# Patient Record
Sex: Male | Born: 1970 | Race: White | Hispanic: No | Marital: Married | Smoking: Never smoker
Health system: Southern US, Community
[De-identification: ages and names within clinical notes are randomized; demographics above are authoritative.]

## PROBLEM LIST (undated history)

## (undated) DIAGNOSIS — F329 Major depressive disorder, single episode, unspecified: Secondary | ICD-10-CM

## (undated) DIAGNOSIS — F32A Depression, unspecified: Secondary | ICD-10-CM

## (undated) DIAGNOSIS — E785 Hyperlipidemia, unspecified: Secondary | ICD-10-CM

## (undated) HISTORY — PX: ELBOW ARTHROSCOPY: SUR87

## (undated) HISTORY — PX: KNEE ARTHROSCOPY: SUR90

## (undated) HISTORY — PX: CARDIAC CATHETERIZATION: SHX172

## (undated) HISTORY — PX: SHOULDER ARTHROSCOPY: SHX128

---

## 2003-12-05 ENCOUNTER — Other Ambulatory Visit: Payer: Self-pay

## 2007-11-22 ENCOUNTER — Ambulatory Visit: Payer: Self-pay | Admitting: General Practice

## 2009-12-17 ENCOUNTER — Emergency Department: Payer: Self-pay | Admitting: Emergency Medicine

## 2010-07-27 ENCOUNTER — Observation Stay (HOSPITAL_COMMUNITY): Admission: EM | Admit: 2010-07-27 | Discharge: 2010-07-29 | Payer: Self-pay | Admitting: Emergency Medicine

## 2010-08-15 ENCOUNTER — Ambulatory Visit: Payer: Self-pay | Admitting: General Practice

## 2010-09-04 ENCOUNTER — Ambulatory Visit: Payer: Self-pay | Admitting: General Practice

## 2011-01-09 LAB — CBC
HCT: 36.2 % — ABNORMAL LOW (ref 39.0–52.0)
HCT: 36.9 % — ABNORMAL LOW (ref 39.0–52.0)
HCT: 39.6 % (ref 39.0–52.0)
Hemoglobin: 12.6 g/dL — ABNORMAL LOW (ref 13.0–17.0)
Hemoglobin: 12.7 g/dL — ABNORMAL LOW (ref 13.0–17.0)
Hemoglobin: 14.1 g/dL (ref 13.0–17.0)
MCH: 29.4 pg (ref 26.0–34.0)
MCH: 29.7 pg (ref 26.0–34.0)
MCH: 30.3 pg (ref 26.0–34.0)
MCHC: 34.4 g/dL (ref 30.0–36.0)
MCHC: 34.8 g/dL (ref 30.0–36.0)
MCHC: 35.6 g/dL (ref 30.0–36.0)
MCV: 85 fL (ref 78.0–100.0)
MCV: 85.4 fL (ref 78.0–100.0)
MCV: 85.4 fL (ref 78.0–100.0)
Platelets: 219 10*3/uL (ref 150–400)
Platelets: 223 10*3/uL (ref 150–400)
Platelets: 231 10*3/uL (ref 150–400)
RBC: 4.24 MIL/uL (ref 4.22–5.81)
RBC: 4.32 MIL/uL (ref 4.22–5.81)
RBC: 4.66 MIL/uL (ref 4.22–5.81)
RDW: 12.6 % (ref 11.5–15.5)
RDW: 12.7 % (ref 11.5–15.5)
RDW: 12.7 % (ref 11.5–15.5)
WBC: 6.4 10*3/uL (ref 4.0–10.5)
WBC: 6.6 10*3/uL (ref 4.0–10.5)
WBC: 7.4 10*3/uL (ref 4.0–10.5)

## 2011-01-09 LAB — CK TOTAL AND CKMB (NOT AT ARMC)
CK, MB: 1.2 ng/mL (ref 0.3–4.0)
Relative Index: INVALID (ref 0.0–2.5)
Total CK: 98 U/L (ref 7–232)

## 2011-01-09 LAB — COMPREHENSIVE METABOLIC PANEL
ALT: 19 U/L (ref 0–53)
AST: 24 U/L (ref 0–37)
Albumin: 3.6 g/dL (ref 3.5–5.2)
Alkaline Phosphatase: 58 U/L (ref 39–117)
BUN: 16 mg/dL (ref 6–23)
CO2: 23 mEq/L (ref 19–32)
Calcium: 8.8 mg/dL (ref 8.4–10.5)
Chloride: 104 mEq/L (ref 96–112)
Creatinine, Ser: 1.05 mg/dL (ref 0.4–1.5)
GFR calc Af Amer: >60 mL/min (ref >60)
GFR calc non Af Amer: >60 mL/min (ref >60)
Glucose, Bld: 105 mg/dL — ABNORMAL HIGH (ref 70–99)
Potassium: 3.7 mEq/L (ref 3.5–5.1)
Sodium: 133 mEq/L — ABNORMAL LOW (ref 135–145)
Total Bilirubin: 0.9 mg/dL (ref 0.3–1.2)
Total Protein: 6.1 g/dL (ref 6.0–8.3)

## 2011-01-09 LAB — CARDIAC PANEL(CRET KIN+CKTOT+MB+TROPI)
CK, MB: 1.6 ng/mL (ref 0.3–4.0)
CK, MB: 1.7 ng/mL (ref 0.3–4.0)
Relative Index: 1.4 (ref 0.0–2.5)
Relative Index: 1.5 (ref 0.0–2.5)
Total CK: 111 U/L (ref 7–232)
Total CK: 116 U/L (ref 7–232)
Troponin I: 0.02 ng/mL (ref 0.00–0.06)
Troponin I: 0.02 ng/mL (ref 0.00–0.06)

## 2011-01-09 LAB — GLUCOSE, CAPILLARY: Glucose-Capillary: 123 mg/dL — ABNORMAL HIGH (ref 70–99)

## 2011-01-09 LAB — HEPARIN LEVEL (UNFRACTIONATED)
Heparin Unfractionated: 0.29 IU/mL — ABNORMAL LOW (ref 0.30–0.70)
Heparin Unfractionated: 0.3 IU/mL (ref 0.30–0.70)
Heparin Unfractionated: 0.46 IU/mL (ref 0.30–0.70)

## 2011-01-09 LAB — POCT CARDIAC MARKERS
CKMB, poc: <1 ng/mL — ABNORMAL LOW (ref 1.0–8.0)
Myoglobin, poc: 51.8 ng/mL (ref 12–200)
Troponin i, poc: <0.05 ng/mL (ref 0.00–0.09)

## 2011-01-09 LAB — BASIC METABOLIC PANEL
BUN: 7 mg/dL (ref 6–23)
CO2: 29 mEq/L (ref 19–32)
Calcium: 8.9 mg/dL (ref 8.4–10.5)
Chloride: 106 mEq/L (ref 96–112)
Creatinine, Ser: 0.94 mg/dL (ref 0.4–1.5)
GFR calc Af Amer: >60 mL/min (ref >60)
GFR calc non Af Amer: >60 mL/min (ref >60)
Glucose, Bld: 90 mg/dL (ref 70–99)
Potassium: 3.8 mEq/L (ref 3.5–5.1)
Sodium: 140 mEq/L (ref 135–145)

## 2011-01-09 LAB — DIFFERENTIAL
Basophils Absolute: 0 10*3/uL (ref 0.0–0.1)
Basophils Relative: 0 % (ref 0–1)
Eosinophils Absolute: 0.6 10*3/uL (ref 0.0–0.7)
Eosinophils Relative: 8 % — ABNORMAL HIGH (ref 0–5)
Lymphocytes Relative: 30 % (ref 12–46)
Lymphs Abs: 2.2 10*3/uL (ref 0.7–4.0)
Monocytes Absolute: 0.8 10*3/uL (ref 0.1–1.0)
Monocytes Relative: 10 % (ref 3–12)
Neutro Abs: 3.8 10*3/uL (ref 1.7–7.7)
Neutrophils Relative %: 51 % (ref 43–77)

## 2011-01-09 LAB — TSH: TSH: 2.766 u[IU]/mL (ref 0.350–4.500)

## 2011-01-09 LAB — APTT
aPTT: 34 seconds (ref 24–37)
aPTT: 97 seconds — ABNORMAL HIGH (ref 24–37)

## 2011-01-09 LAB — PROTIME-INR
INR: 0.92 (ref 0.00–1.49)
INR: 0.92 (ref 0.00–1.49)
Prothrombin Time: 12.6 seconds (ref 11.6–15.2)
Prothrombin Time: 12.6 seconds (ref 11.6–15.2)

## 2011-01-09 LAB — TROPONIN I: Troponin I: 0.02 ng/mL (ref 0.00–0.06)

## 2011-01-09 LAB — D-DIMER, QUANTITATIVE: D-Dimer, Quant: <0.22 ug/mL-FEU (ref 0.00–0.48)

## 2011-01-09 LAB — MAGNESIUM: Magnesium: 1.9 mg/dL (ref 1.5–2.5)

## 2011-01-09 LAB — MRSA PCR SCREENING: MRSA by PCR: NEGATIVE

## 2011-01-16 ENCOUNTER — Ambulatory Visit: Payer: Self-pay | Admitting: General Practice

## 2012-08-19 ENCOUNTER — Ambulatory Visit: Payer: Self-pay | Admitting: Gastroenterology

## 2014-09-13 ENCOUNTER — Ambulatory Visit: Payer: Self-pay | Admitting: General Practice

## 2015-03-15 ENCOUNTER — Other Ambulatory Visit: Payer: Self-pay | Admitting: Gastroenterology

## 2015-03-15 DIAGNOSIS — R1310 Dysphagia, unspecified: Secondary | ICD-10-CM

## 2015-03-19 ENCOUNTER — Ambulatory Visit: Payer: Self-pay | Attending: Gastroenterology

## 2015-05-03 ENCOUNTER — Encounter: Payer: Self-pay | Admitting: *Deleted

## 2015-05-04 ENCOUNTER — Ambulatory Visit: Payer: BLUE CROSS/BLUE SHIELD | Admitting: Anesthesiology

## 2015-05-04 ENCOUNTER — Encounter
Admission: RE | Disposition: A | Discharge status: Home / Self Care | Payer: Self-pay | Source: Ambulatory Visit | Attending: Gastroenterology

## 2015-05-04 ENCOUNTER — Encounter: Payer: Self-pay | Admitting: *Deleted

## 2015-05-04 ENCOUNTER — Ambulatory Visit
Admission: RE | Admit: 2015-05-04 | Discharge: 2015-05-04 | Disposition: A | Discharge status: Home / Self Care | Payer: BLUE CROSS/BLUE SHIELD | Source: Ambulatory Visit | Attending: Gastroenterology | Admitting: Gastroenterology

## 2015-05-04 DIAGNOSIS — R1310 Dysphagia, unspecified: Secondary | ICD-10-CM | POA: Diagnosis not present

## 2015-05-04 DIAGNOSIS — Z7982 Long term (current) use of aspirin: Secondary | ICD-10-CM | POA: Diagnosis not present

## 2015-05-04 DIAGNOSIS — Z8371 Family history of colonic polyps: Secondary | ICD-10-CM | POA: Insufficient documentation

## 2015-05-04 DIAGNOSIS — E785 Hyperlipidemia, unspecified: Secondary | ICD-10-CM | POA: Diagnosis not present

## 2015-05-04 DIAGNOSIS — K319 Disease of stomach and duodenum, unspecified: Secondary | ICD-10-CM | POA: Insufficient documentation

## 2015-05-04 DIAGNOSIS — D123 Benign neoplasm of transverse colon: Secondary | ICD-10-CM | POA: Diagnosis not present

## 2015-05-04 DIAGNOSIS — Z800 Family history of malignant neoplasm of digestive organs: Secondary | ICD-10-CM | POA: Insufficient documentation

## 2015-05-04 DIAGNOSIS — K2960 Other gastritis without bleeding: Secondary | ICD-10-CM | POA: Diagnosis not present

## 2015-05-04 DIAGNOSIS — K209 Esophagitis, unspecified: Secondary | ICD-10-CM | POA: Diagnosis not present

## 2015-05-04 DIAGNOSIS — I252 Old myocardial infarction: Secondary | ICD-10-CM | POA: Diagnosis not present

## 2015-05-04 DIAGNOSIS — K644 Residual hemorrhoidal skin tags: Secondary | ICD-10-CM | POA: Insufficient documentation

## 2015-05-04 DIAGNOSIS — Z79899 Other long term (current) drug therapy: Secondary | ICD-10-CM | POA: Insufficient documentation

## 2015-05-04 HISTORY — PX: COLONOSCOPY WITH PROPOFOL: SHX5780

## 2015-05-04 HISTORY — PX: ESOPHAGOGASTRODUODENOSCOPY: SHX5428

## 2015-05-04 HISTORY — DX: Hyperlipidemia, unspecified: E78.5

## 2015-05-04 HISTORY — DX: Depression, unspecified: F32.A

## 2015-05-04 HISTORY — DX: Major depressive disorder, single episode, unspecified: F32.9

## 2015-05-04 SURGERY — COLONOSCOPY WITH PROPOFOL
Anesthesia: General

## 2015-05-04 MED ORDER — MIDAZOLAM HCL 5 MG/5ML IJ SOLN
INTRAMUSCULAR | Status: DC | PRN
  Administered 2015-05-04: 1 mg via INTRAVENOUS

## 2015-05-04 MED ORDER — PROPOFOL INFUSION 10 MG/ML OPTIME
INTRAVENOUS | Status: DC | PRN
  Administered 2015-05-04: 140 ug/kg/min via INTRAVENOUS

## 2015-05-04 MED ORDER — SODIUM CHLORIDE 0.9 % IV SOLN
INTRAVENOUS | Status: DC
  Administered 2015-05-04: 1000 mL via INTRAVENOUS

## 2015-05-04 MED ORDER — PROPOFOL 10 MG/ML IV BOLUS
INTRAVENOUS | Status: DC | PRN
  Administered 2015-05-04: 40 mg via INTRAVENOUS
  Administered 2015-05-04: 50 mg via INTRAVENOUS

## 2015-05-04 MED ORDER — FENTANYL CITRATE (PF) 100 MCG/2ML IJ SOLN
INTRAMUSCULAR | Status: DC | PRN
  Administered 2015-05-04: 50 ug via INTRAVENOUS

## 2015-05-04 NOTE — H&P (Signed)
Outpatient short stay form Pre-procedure 05/04/2015 11:16 AM Lollie Sails MD  Primary Physician: Dr. Burt Ek  Reason for visit:  EGD and colonoscopy  History of present illness:  Patient is a 44 year old male seen for evaluation of dysphagia as well as a family history of colon polyps at a younger age. His sister had colon polyps 34. Is a family history of colon cancer and a secondary relative. In regards to his dysphagia this is been going on for at least 4 years progressively seemed to be worse is no painful swallowing. He has not been taking a PPI.    Current facility-administered medications:  .  0.9 %  sodium chloride infusion, , Intravenous, Continuous, Lollie Sails, MD, Last Rate: 20 mL/hr at 05/04/15 1042, 1,000 mL at 05/04/15 1042  Prescriptions prior to admission  Medication Sig Dispense Refill Last Dose  . aspirin EC 81 MG tablet Take 81 mg by mouth daily.   Past Week at Unknown time  . fenofibrate 160 MG tablet Take 160 mg by mouth daily.   Past Week at Unknown time  . Multiple Vitamin (MULTIVITAMIN) tablet Take 1 tablet by mouth daily.   Past Week at Unknown time  . omega-3 acid ethyl esters (LOVAZA) 1 G capsule Take 1 g by mouth daily.   Past Week at Unknown time     No Known Allergies   Past Medical History  Diagnosis Date  . Hyperlipidemia   . Depression     Review of systems:      Physical Exam    Heart and lungs: Regular rate and rhythm without rub or gallop lungs are bilaterally clear    HEENT: Normocephalic atraumatic eyes are anicteric    Other:     Pertinant exam for procedure: Soft nontender nondistended bowel sounds positive normoactive    Planned proceedures: EGD and colonoscopy with indicated procedures I have discussed the risks benefits and complications of procedures to include not limited to bleeding, infection, perforation and the risk of sedation and the patient wishes to proceed.    Lollie Sails,  MD Gastroenterology 05/04/2015  11:16 AM

## 2015-05-04 NOTE — Transfer of Care (Signed)
Immediate Anesthesia Transfer of Care Note  Patient: Cory Cox  Procedure(s) Performed: Procedure(s): COLONOSCOPY WITH PROPOFOL (N/A) ESOPHAGOGASTRODUODENOSCOPY (EGD) (N/A)  Patient Location: PACU and Endoscopy Unit  Anesthesia Type:General  Level of Consciousness: awake, alert  and oriented  Airway & Oxygen Therapy: Patient Spontanous Breathing and Patient connected to nasal cannula oxygen  Post-op Assessment: Report given to RN and Post -op Vital signs reviewed and stable  Post vital signs: Reviewed and stable  Last Vitals:  Filed Vitals:   05/04/15 1221  BP:   Pulse:   Temp: 36.3 C  Resp:     Complications: No apparent anesthesia complications

## 2015-05-04 NOTE — Anesthesia Postprocedure Evaluation (Signed)
  Anesthesia Post-op Note  Patient: Cory Cox  Procedure(s) Performed: Procedure(s): COLONOSCOPY WITH PROPOFOL (N/A) ESOPHAGOGASTRODUODENOSCOPY (EGD) (N/A)  Anesthesia type:General  Patient location: PACU  Post pain: Pain level controlled  Post assessment: Post-op Vital signs reviewed, Patient's Cardiovascular Status Stable, Respiratory Function Stable, Patent Airway and No signs of Nausea or vomiting  Post vital signs: Reviewed and stable  Last Vitals:  Filed Vitals:   05/04/15 1249  BP: 115/81  Pulse:   Temp:   Resp: 17    Level of consciousness: awake, alert  and patient cooperative  Complications: No apparent anesthesia complications

## 2015-05-04 NOTE — H&P (Deleted)
Outpatient short stay form Pre-procedure 05/04/2015 10:17 AM Lollie Sails MD  Primary Physician: Dr. Midge Minium  Reason for visit:  Colonoscopy  History of present illness:  Patient is a 44 year old male with a personal history of adenomatous colon polyps. Her last last was apparently done in 2013. He had polyps on that procedure. He had a EGD was done by Dr. Buddy Duty in [place] at [place].  Patient denies any problems with nausea vomiting heartburn or dysphagia. Does take Protonix twice a day. No apparent history of its esophagus. Take Carafate.    Current facility-administered medications:  .  0.9 %  sodium chloride infusion, , Intravenous, Continuous, Lollie Sails, MD  Prescriptions prior to admission  Medication Sig Dispense Refill Last Dose  . aspirin EC 81 MG tablet Take 81 mg by mouth daily.     . fenofibrate 160 MG tablet Take 160 mg by mouth daily.     . Multiple Vitamin (MULTIVITAMIN) tablet Take 1 tablet by mouth daily.     Marland Kitchen omega-3 acid ethyl esters (LOVAZA) 1 G capsule Take 1 g by mouth daily.        Allergies not on file   Past Medical History  Diagnosis Date  . Hyperlipidemia   . Depression     Review of systems:      Physical Exam    Heart and lungs: Regular rate and rhythm without rub or gallop lungs are bilaterally clear    HEENT: Normocephalic atraumatic eyes are anicteric    Other:    Pertinant exam for procedure: Soft nontender nondistended bowel sounds positive normoactive    Planned proceedures:  colonoscopy and indicated procedures I have discussed the risks benefits and complications of procedures to include not limited to bleeding, infection, perforation and the risk of sedation and the patient wishes to proceed.    Lollie Sails, MD Gastroenterology 05/04/2015  10:17 AM

## 2015-05-04 NOTE — Anesthesia Procedure Notes (Signed)
Date/Time: 05/04/2015 11:25 AM Performed by: Kennon Holter Pre-anesthesia Checklist: Timeout performed, Patient being monitored, Suction available, Emergency Drugs available and Patient identified Patient Re-evaluated:Patient Re-evaluated prior to inductionOxygen Delivery Method: Nasal cannula Preoxygenation: Pre-oxygenation with 100% oxygen Intubation Type: IV induction

## 2015-05-04 NOTE — Op Note (Signed)
[facility] Gastroenterology Patient Name: Cory Cox Procedure Date: 05/04/2015 11:12 AM MRN: [id number] Account #: [id number] Date of Birth: 1971/07/16 Admit Type: Outpatient Age: 44 Room: [facility] ENDO ROOM 3 Gender: Male Note Status: Finalized Procedure:         Colonoscopy Indications:       Family history of colon cancer in a distant relative,                     Family history of colonic polyps in a first-degree relative Providers:         Lollie Sails, MD Referring MD:      Shepard General, MD (Referring MD) Medicines:         Monitored Anesthesia Care Complications:     No immediate complications. Procedure:         Pre-Anesthesia Assessment:                    - ASA Grade Assessment: II - A patient with mild systemic                     disease.                    After obtaining informed consent, the colonoscope was                     passed under direct vision. Throughout the procedure, the                     patient's blood pressure, pulse, and oxygen saturations                     were monitored continuously. The Olympus PCF-160AL                     colonoscope (S#. [id number]) was introduced through the anus                     and advanced to the the cecum, identified by appendiceal                     orifice and ileocecal valve. The patient tolerated the                     procedure well. The quality of the bowel preparation was                     good. Findings:      A 4 mm polyp was found in the distal transverse colon. The polyp was       flat. The polyp was removed with a cold snare. Resection and retrieval       were complete.      The digital rectal exam was normal.      The retroflexed view of the distal rectum and anal verge was normal and       showed no anal or rectal abnormalities.      The exam was otherwise without abnormality. Impression:        - One 4 mm polyp in the distal transverse colon. Resected    and retrieved.                    - The distal rectum and anal verge are normal on  retroflexion view. Recommendation:    - Await pathology results.                    - Telephone GI clinic for pathology results in 1 week. Procedure Code(s): --- Professional ---                    [phone], Colonoscopy, flexible; with removal of tumor(s),                     polyp(s), or other lesion(s) by snare technique Diagnosis Code(s): --- Professional ---                    211.3, Benign neoplasm of colon                    V16.0, Family history of malignant neoplasm of                     gastrointestinal tract                    V18.51, Family history of colonic polyps CPT copyright 2014 American Medical Association. All rights reserved. The codes documented in this report are preliminary and upon coder review may  be revised to meet current compliance requirements. Lollie Sails, MD 05/04/2015 12:14:21 PM This report has been signed electronically. Number of Addenda: 0 Note Initiated On: 05/04/2015 11:12 AM Scope Withdrawal Time: 0 hours 6 minutes 26 seconds  Total Procedure Duration: 0 hours 17 minutes 3 seconds       [facility]

## 2015-05-04 NOTE — Op Note (Signed)
[facility] Gastroenterology Patient Name: Cory Cox Procedure Date: 05/04/2015 11:14 AM MRN: [id number] Account #: [id number] Date of Birth: 1971/08/17 Admit Type: Outpatient Age: 44 Room: [facility] ENDO ROOM 3 Gender: Male Note Status: Finalized Procedure:         Upper GI endoscopy Indications:       Dysphagia Providers:         Lollie Sails, MD Referring MD:      Shepard General, MD (Referring MD) Medicines:         Monitored Anesthesia Care Complications:     No immediate complications. Procedure:         Pre-Anesthesia Assessment:                    - ASA Grade Assessment: II - A patient with mild systemic                     disease.                    After obtaining informed consent, the endoscope was passed                     under direct vision. Throughout the procedure, the                     patient's blood pressure, pulse, and oxygen saturations                     were monitored continuously. The Olympus GIF-160 endoscope                     (S#. [id number]) was introduced through the mouth, and                     advanced to the third part of duodenum. The upper GI                     endoscopy was accomplished without difficulty. The patient                     tolerated the procedure well. Findings:      the esophagus is noted to have some apparent furrowing, with a clinical       appearance of possible eosinophillic esophagitis. There were no dominant       rings or apparent gross narrowing. Biopsies were taken at 28 cm from       thte incisors. Biopsies were also taken from the [place].      LA Grade A (one or more mucosal breaks less than 5 mm, not extending       between tops of 2 mucosal folds) esophagitis with no bleeding was found.       Biopsies were taken with a cold forceps for histology.      Patchy mild inflammation characterized by erosions was found in the       gastric antrum. Biopsies were taken with a cold forceps for  histology.      The cardia and gastric fundus were normal on retroflexion.      The examined duodenum was normal.      A small non-obstructing nodule was found on the right arytenoid. Impression:        - LA Grade A erosive esophagitis. Biopsied.                    -  Erosive gastritis. Biopsied.                    - Normal examined duodenum. Recommendation:    - Await pathology results.                    - Use Protonix (pantoprazole) 40 mg PO BID for 3 weeks.                    - Use Protonix (pantoprazole) 40 mg PO daily daily.                    - Refer to an ENT specialist at appointment to be                     scheduled. Procedure Code(s): --- Professional ---                    [phone], Esophagogastroduodenoscopy, flexible, transoral;                     with biopsy, single or multiple Diagnosis Code(s): --- Professional ---                    530.19, Other esophagitis                    535.40, Other specified gastritis, without mention of                     hemorrhage                    787.20, Dysphagia, unspecified CPT copyright 2014 American Medical Association. All rights reserved. The codes documented in this report are preliminary and upon coder review may  be revised to meet current compliance requirements. Lollie Sails, MD 05/04/2015 11:52:56 AM This report has been signed electronically. Number of Addenda: 0 Note Initiated On: 05/04/2015 11:14 AM Total Procedure Duration: 0 hours 16 minutes 12 seconds       [facility]

## 2015-05-04 NOTE — Anesthesia Preprocedure Evaluation (Signed)
Anesthesia Evaluation  Patient identified by MRN, date of birth, ID band Patient awake    Reviewed: Allergy & Precautions, H&P , NPO status , Patient's Chart, lab work & pertinent test results, reviewed documented beta blocker date and time   Airway Mallampati: II  TM Distance: >3 FB Neck ROM: full    Dental no notable dental hx. (+) Teeth Intact   Pulmonary neg pulmonary ROS,  breath sounds clear to auscultation  Pulmonary exam normal       Cardiovascular - Past MI negative cardio ROS Normal cardiovascular examRhythm:regular Rate:Normal     Neuro/Psych PSYCHIATRIC DISORDERS negative neurological ROS     GI/Hepatic negative GI ROS, Neg liver ROS,   Endo/Other  negative endocrine ROS  Renal/GU negative Renal ROS  negative genitourinary   Musculoskeletal   Abdominal   Peds  Hematology negative hematology ROS (+)   Anesthesia Other Findings Past Medical History:   Hyperlipidemia                                               Depression                                                   Reproductive/Obstetrics negative OB ROS                             Anesthesia Physical Anesthesia Plan  ASA: II  Anesthesia Plan: General   Post-op Pain Management:    Induction:   Airway Management Planned:   Additional Equipment:   Intra-op Plan:   Post-operative Plan:   Informed Consent: I have reviewed the patients History and Physical, chart, labs and discussed the procedure including the risks, benefits and alternatives for the proposed anesthesia with the patient or authorized representative who has indicated his/her understanding and acceptance.   Dental Advisory Given  Plan Discussed with: Anesthesiologist, CRNA and Surgeon  Anesthesia Plan Comments:         Anesthesia Quick Evaluation

## 2015-05-07 ENCOUNTER — Encounter: Payer: Self-pay | Admitting: Gastroenterology

## 2015-05-09 LAB — SURGICAL PATHOLOGY

## 2015-05-13 IMAGING — CR DG LUMBAR SPINE COMPLETE 4+V
1 series · 5 of 5 positions shown · non-contrast
Comparison: None.

CLINICAL DATA: Right-sided low back pain for 8 months, no injury

EXAM:
LUMBAR SPINE - COMPLETE 4+ VIEW

[Series 1: dxr lumbar spine with obliques · 0.14mm/px · 5 of 5 slices shown]
[im 1/5]
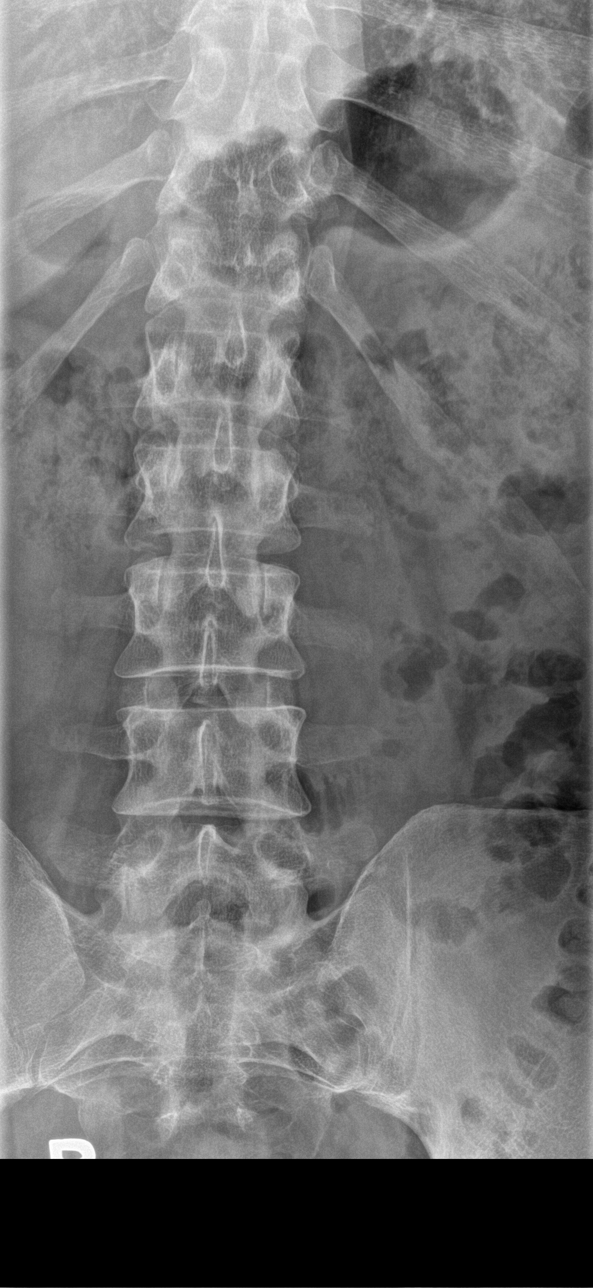
[im 2/5]
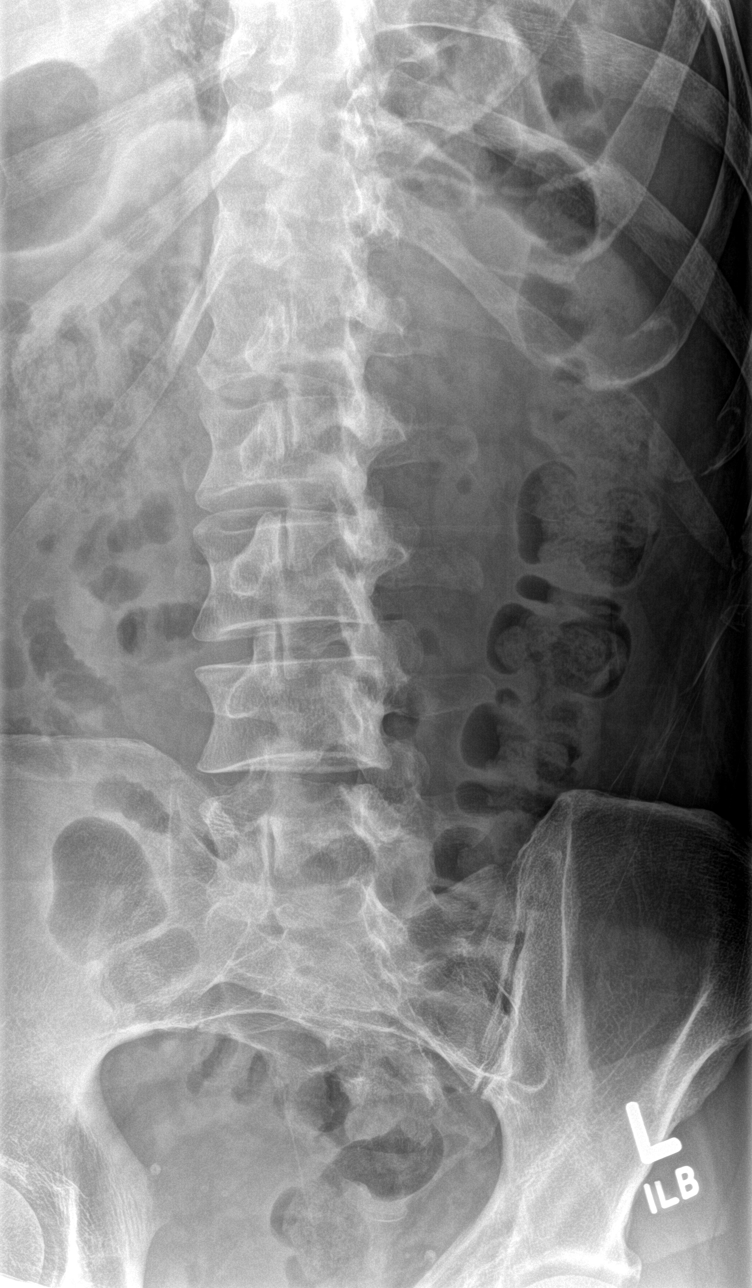
[im 3/5]
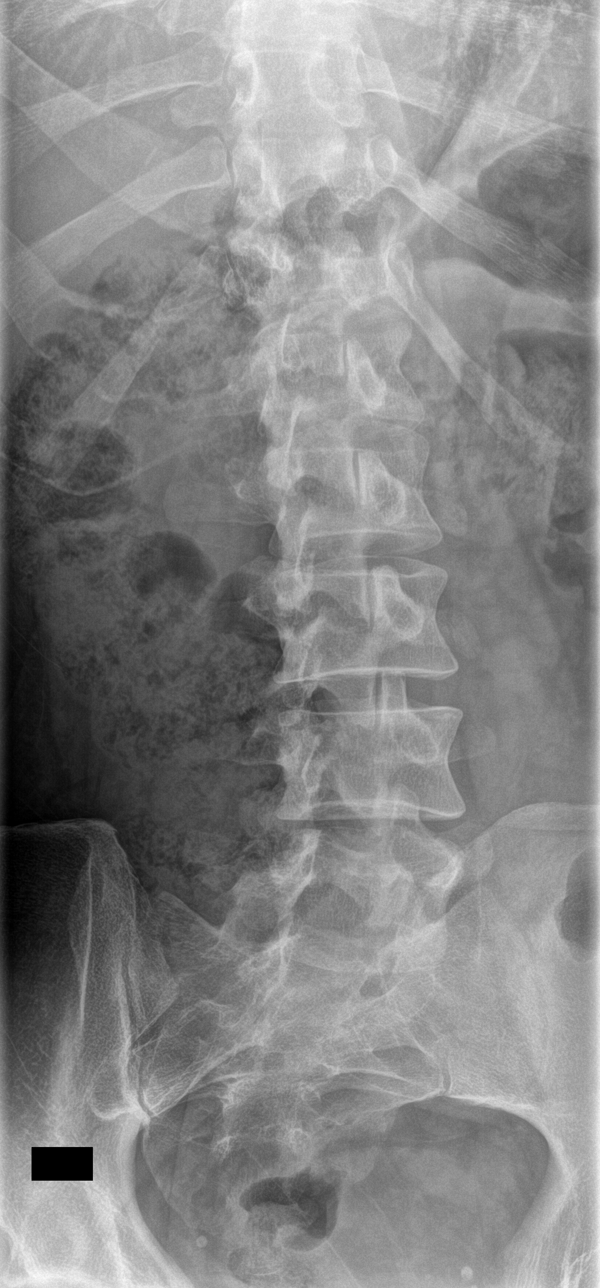
[im 4/5]
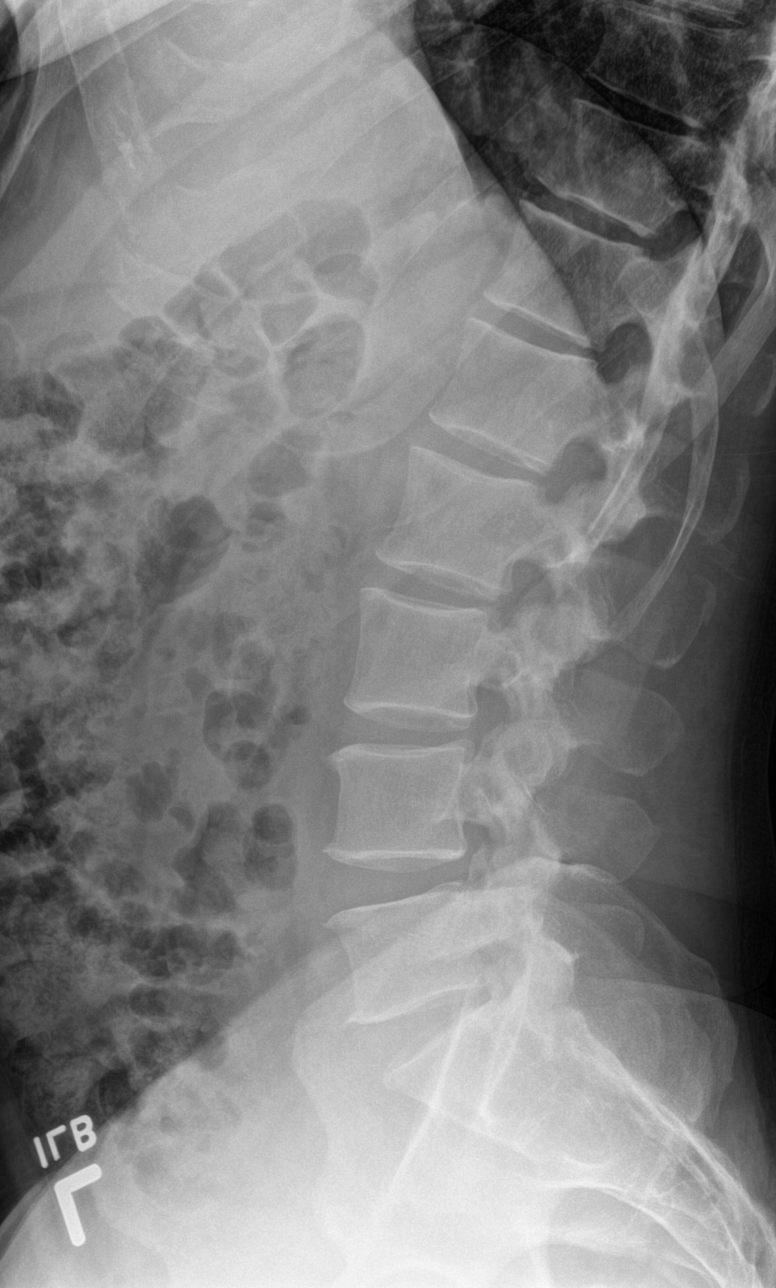
[im 5/5]
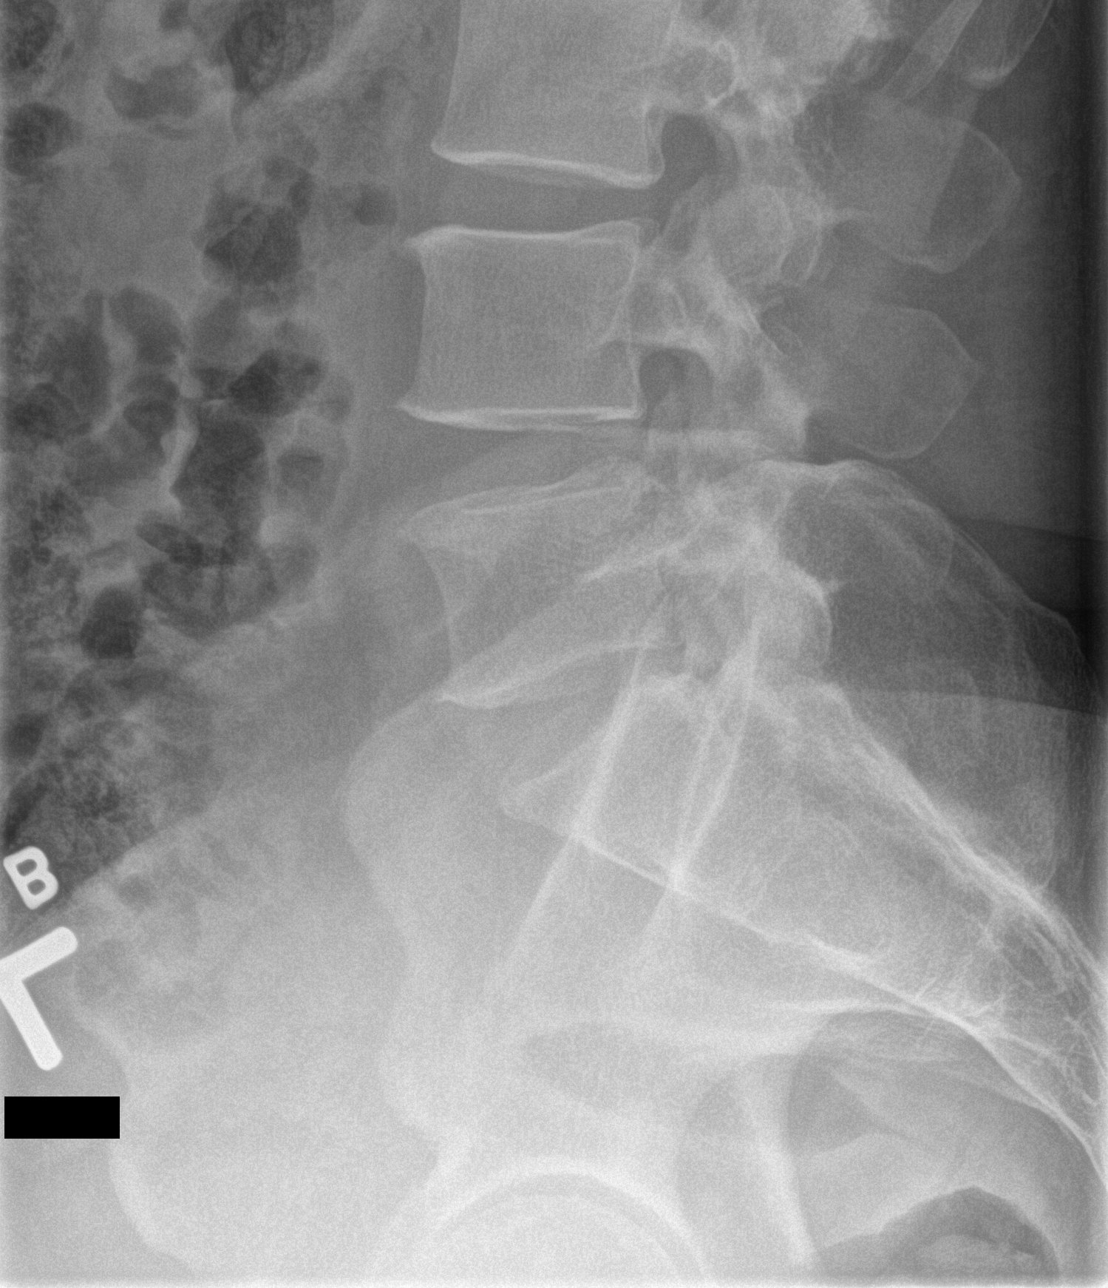

[5 of 5 positions shown; findings below may reference images not displayed]

FINDINGS: The lumbar vertebrae are in normal alignment. Intervertebral disc
spaces appear normal. No compression deformity is seen. Minimal
anterior osteophyte formation is noted at L4-5 and L5-S1. The SI
joints appear corticated.
IMPRESSION: Normal alignment.  Normal intervertebral disc spaces.

## 2015-07-26 ENCOUNTER — Encounter: Payer: Self-pay | Admitting: *Deleted

## 2015-07-27 ENCOUNTER — Ambulatory Visit
Admission: RE | Admit: 2015-07-27 | Discharge: 2015-07-27 | Disposition: A | Discharge status: Home Health | Payer: BLUE CROSS/BLUE SHIELD | Source: Ambulatory Visit | Attending: Gastroenterology | Admitting: Gastroenterology

## 2015-07-27 ENCOUNTER — Ambulatory Visit: Payer: BLUE CROSS/BLUE SHIELD | Admitting: Anesthesiology

## 2015-07-27 ENCOUNTER — Encounter
Admission: RE | Disposition: A | Discharge status: Home Health | Payer: Self-pay | Source: Ambulatory Visit | Attending: Gastroenterology

## 2015-07-27 ENCOUNTER — Encounter: Payer: Self-pay | Admitting: *Deleted

## 2015-07-27 DIAGNOSIS — K222 Esophageal obstruction: Secondary | ICD-10-CM | POA: Diagnosis not present

## 2015-07-27 DIAGNOSIS — E785 Hyperlipidemia, unspecified: Secondary | ICD-10-CM | POA: Insufficient documentation

## 2015-07-27 DIAGNOSIS — K200 Eosinophilic esophagitis: Secondary | ICD-10-CM | POA: Diagnosis present

## 2015-07-27 DIAGNOSIS — Z79899 Other long term (current) drug therapy: Secondary | ICD-10-CM | POA: Insufficient documentation

## 2015-07-27 DIAGNOSIS — F329 Major depressive disorder, single episode, unspecified: Secondary | ICD-10-CM | POA: Insufficient documentation

## 2015-07-27 DIAGNOSIS — R1310 Dysphagia, unspecified: Secondary | ICD-10-CM | POA: Diagnosis not present

## 2015-07-27 DIAGNOSIS — Z7982 Long term (current) use of aspirin: Secondary | ICD-10-CM | POA: Insufficient documentation

## 2015-07-27 HISTORY — PX: ESOPHAGOGASTRODUODENOSCOPY (EGD) WITH PROPOFOL: SHX5813

## 2015-07-27 SURGERY — ESOPHAGOGASTRODUODENOSCOPY (EGD) WITH PROPOFOL
Anesthesia: General

## 2015-07-27 MED ORDER — PROPOFOL 10 MG/ML IV BOLUS
INTRAVENOUS | Status: DC | PRN
  Administered 2015-07-27 (×3): 50 mg via INTRAVENOUS

## 2015-07-27 MED ORDER — SODIUM CHLORIDE 0.9 % IV SOLN
INTRAVENOUS | Status: DC
  Administered 2015-07-27: 16:00:00 via INTRAVENOUS
  Administered 2015-07-27: 1000 mL via INTRAVENOUS

## 2015-07-27 MED ORDER — SODIUM CHLORIDE 0.9 % IV SOLN: INTRAVENOUS | Status: DC

## 2015-07-27 MED ORDER — PROPOFOL 500 MG/50ML IV EMUL
INTRAVENOUS | Status: DC | PRN
  Administered 2015-07-27: 120 ug/kg/min via INTRAVENOUS

## 2015-07-27 NOTE — Op Note (Signed)
[facility] Gastroenterology Patient Name: Cory Cox Procedure Date: 07/27/2015 3:51 PM MRN: [id number] Account #: [id number] Date of Birth: July 09, 1971 Admit Type: Outpatient Age: 44 Room: [facility] ENDO ROOM 3 Gender: Male Note Status: Finalized Procedure:         Upper GI endoscopy Indications:       Dysphagia, Follow-up of eosinophilic esophagitis Providers:         Lollie Sails, MD Referring MD:      Shepard General, MD (Referring MD) Medicines:         Monitored Anesthesia Care Complications:     No immediate complications. Procedure:         Pre-Anesthesia Assessment:                    - ASA Grade Assessment: II - A patient with mild systemic                     disease.                    After obtaining informed consent, the endoscope was passed                     under direct vision. Throughout the procedure, the                     patient's blood pressure, pulse, and oxygen saturations                     were monitored continuously. The Endoscope was introduced                     through the mouth, and advanced to the third part of                     duodenum. The upper GI endoscopy was accomplished without                     difficulty. The patient tolerated the procedure well. Findings:      Mucosal changes including longitudinal furrows were found in the entire       esophagus with an intermittant appearance of rings. [facility]       Reference Score].      A non-obstructing Schatzki ring (acquired) was found at the       gastroesophageal junction.      A mild Schatzki ring (acquired) was found at the gastroesophageal       junction. A TTS dilator was passed through the scope. Dilation with a       12-13.5-15 mm balloon (to a maximum balloon size of 15 mm) dilator was       performed. The esophageal diameter when noted relaxed, showed minimal       evidence of rings and opened to largere diameter than the scope      The lower third of  the esophagus was moderately tortuous.      Biopsies were taken with a cold forceps in the middle third (about 28 cm       from the incisors) of the esophagus and at the gastroesophageal junction       for histology.      The entire examined stomach was normal.      The cardia and gastric fundus were normal on retroflexion.      The examined duodenum  was normal. 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                    - Non-obstructing Schatzki ring.                    - Mild Schatzki ring. Dilated.                    - Tortuous esophagus.                    - Normal stomach.                    - Normal examined duodenum.                    - Biopsies were taken with a cold forceps for histology in                     the middle third of the esophagus and in the                     gastroesophageal junction. Recommendation:    - Use Protonix (pantoprazole) 40 mg PO BID daily.                    - Await pathology results.                    - Return to GI clinic in 3 weeks. Procedure Code(s): --- Professional ---                    [phone], Esophagogastroduodenoscopy, flexible, transoral;                     with transendoscopic balloon dilation of esophagus (less                     than 30 mm diameter)                    43239, Esophagogastroduodenoscopy, flexible, transoral;                     with biopsy, single or multiple Diagnosis Code(s): --- Professional ---                    530.9, Unspecified disorder of esophagus                    530.3, Stricture and stenosis of esophagus                    750.4, Other specified anomalies of esophagus                    787.20, Dysphagia, unspecified                    194.17, Eosinophilic esophagitis CPT copyright 2014 American Medical Association. All rights reserved. The codes documented in this report are preliminary and upon coder review may  be revised to  meet current compliance requirements. Lollie Sails, MD 07/27/2015 4:40:51 PM This report has been signed electronically. Number of Addenda: 0 Note Initiated On: 07/27/2015 3:51 PM  [facility]

## 2015-07-27 NOTE — H&P (Signed)
Outpatient short stay form Pre-procedure 07/27/2015 3:50 PM Lollie Sails MD  Primary Physician: No primary care Sherly Brodbeck  Reason for visit:  EGD  History of present illness:  Patient is a 44 year old male presenting today for repeat EGD. He had a procedure done on 05/04/2015 showing some longitudinal furring in the esophagus and biopsies consistent with eosinophilia. No dominant rings or apparent gross narrowing. At that time he had not been on a PPI. Currently he is taking a proton pump inhibitor twice a day and states that her normal dysphagia currently. Alfonse Spruce there is an issue it clears very quickly. Also has taking aspirin or NSAID products.    Current facility-administered medications:  .  0.9 %  sodium chloride infusion, , Intravenous, Continuous, Lollie Sails, MD, Last Rate: 20 mL/hr at 07/27/15 1523 .  0.9 %  sodium chloride infusion, , Intravenous, Continuous, Lollie Sails, MD  Prescriptions prior to admission  Medication Sig Dispense Refill Last Dose  . aspirin EC 81 MG tablet Take 81 mg by mouth daily.   Past Week at Unknown time  . Multiple Vitamin (MULTIVITAMIN) tablet Take 1 tablet by mouth daily.   Past Week at Unknown time  . omega-3 acid ethyl esters (LOVAZA) 1 G capsule Take 1 g by mouth daily.   Past Week at Unknown time  . fenofibrate 160 MG tablet Take 160 mg by mouth daily.   07/27/2015     No Known Allergies   Past Medical History  Diagnosis Date  . Hyperlipidemia   . Depression     Review of systems:      Physical Exam    Heart and lungs: Regular rate and rhythm without rub or gallop lungs are bilaterally clear    HEENT: Normocephalic atraumatic eyes are anicteric    Other:     Pertinant exam for procedure: Soft nontender nondistended bowel sounds positive normoactive    Planned proceedures: EGD and indicated procedures. I have discussed the risks benefits and complications of procedures to include not limited to bleeding,  infection, perforation and the risk of sedation and the patient wishes to proceed.    Lollie Sails, MD Gastroenterology 07/27/2015  3:50 PM

## 2015-07-27 NOTE — Anesthesia Preprocedure Evaluation (Signed)
Anesthesia Evaluation  Patient identified by MRN, date of birth, ID band Patient awake    Reviewed: Allergy & Precautions, H&P , NPO status , Patient's Chart, lab work & pertinent test results, reviewed documented beta blocker date and time   Airway Mallampati: II  TM Distance: >3 FB Neck ROM: full    Dental no notable dental hx.    Pulmonary neg pulmonary ROS,    Pulmonary exam normal breath sounds clear to auscultation       Cardiovascular Exercise Tolerance: Good negative cardio ROS   Rhythm:regular Rate:Normal     Neuro/Psych negative neurological ROS  negative psych ROS   GI/Hepatic negative GI ROS, Neg liver ROS,   Endo/Other  negative endocrine ROS  Renal/GU negative Renal ROS  negative genitourinary   Musculoskeletal   Abdominal   Peds  Hematology negative hematology ROS (+)   Anesthesia Other Findings   Reproductive/Obstetrics negative OB ROS                             Anesthesia Physical Anesthesia Plan  ASA: II  Anesthesia Plan: General   Post-op Pain Management:    Induction:   Airway Management Planned:   Additional Equipment:   Intra-op Plan:   Post-operative Plan:   Informed Consent: I have reviewed the patients History and Physical, chart, labs and discussed the procedure including the risks, benefits and alternatives for the proposed anesthesia with the patient or authorized representative who has indicated his/her understanding and acceptance.   Dental Advisory Given  Plan Discussed with: CRNA  Anesthesia Plan Comments:         Anesthesia Quick Evaluation  

## 2015-07-27 NOTE — Transfer of Care (Signed)
Immediate Anesthesia Transfer of Care Note  Patient: Cory Cox  Procedure(s) Performed: Procedure(s): ESOPHAGOGASTRODUODENOSCOPY (EGD) WITH PROPOFOL (N/A)  Patient Location: PACU  Anesthesia Type:General  Level of Consciousness: sedated  Airway & Oxygen Therapy: Patient Spontanous Breathing and Patient connected to nasal cannula oxygen  Post-op Assessment: Report given to RN  Post vital signs: Reviewed and stable  Last Vitals:  Filed Vitals:   07/27/15 1458  BP: 110/71  Pulse: 72  Temp: 36.7 C  Resp: 18    Complications: No apparent anesthesia complications

## 2015-07-30 NOTE — Anesthesia Postprocedure Evaluation (Signed)
  Anesthesia Post-op Note  Patient: Cory Cox  Procedure(s) Performed: Procedure(s): ESOPHAGOGASTRODUODENOSCOPY (EGD) WITH PROPOFOL (N/A)  Anesthesia type:General  Patient location: PACU  Post pain: Pain level controlled  Post assessment: Post-op Vital signs reviewed, Patient's Cardiovascular Status Stable, Respiratory Function Stable, Patent Airway and No signs of Nausea or vomiting  Post vital signs: Reviewed and stable  Last Vitals:  Filed Vitals:   07/27/15 1705  BP: 107/74  Pulse: 68  Temp:   Resp: 13    Level of consciousness: awake, alert  and patient cooperative  Complications: No apparent anesthesia complications

## 2015-07-31 LAB — SURGICAL PATHOLOGY

## 2015-08-17 ENCOUNTER — Encounter: Payer: Self-pay | Admitting: Gastroenterology

## 2019-07-14 ENCOUNTER — Other Ambulatory Visit: Payer: Self-pay | Admitting: Internal Medicine

## 2023-04-13 ENCOUNTER — Ambulatory Visit: Payer: 59

## 2023-04-13 DIAGNOSIS — K222 Esophageal obstruction: Secondary | ICD-10-CM

## 2023-04-13 DIAGNOSIS — K200 Eosinophilic esophagitis: Secondary | ICD-10-CM
